# Patient Record
Sex: Female | Born: 1960 | Race: White | Hispanic: No | Marital: Married
Health system: Southern US, Community
[De-identification: ages and names within clinical notes are randomized; demographics above are authoritative.]

---

## 1998-01-28 ENCOUNTER — Inpatient Hospital Stay (HOSPITAL_COMMUNITY): Admission: AD | Admit: 1998-01-28 | Discharge: 1998-01-30 | Payer: Self-pay | Admitting: Obstetrics & Gynecology

## 1999-02-25 ENCOUNTER — Other Ambulatory Visit: Admission: RE | Admit: 1999-02-25 | Discharge: 1999-02-25 | Payer: Self-pay | Admitting: Obstetrics and Gynecology

## 2000-03-13 ENCOUNTER — Other Ambulatory Visit: Admission: RE | Admit: 2000-03-13 | Discharge: 2000-03-13 | Payer: Self-pay | Admitting: Obstetrics and Gynecology

## 2002-08-13 ENCOUNTER — Other Ambulatory Visit: Admission: RE | Admit: 2002-08-13 | Discharge: 2002-08-13 | Payer: Self-pay | Admitting: Obstetrics and Gynecology

## 2003-04-03 ENCOUNTER — Encounter: Admission: RE | Admit: 2003-04-03 | Discharge: 2003-04-03 | Payer: Self-pay | Admitting: Family Medicine

## 2003-04-03 ENCOUNTER — Encounter: Payer: Self-pay | Admitting: Family Medicine

## 2003-04-03 ENCOUNTER — Other Ambulatory Visit: Admission: RE | Admit: 2003-04-03 | Discharge: 2003-04-03 | Payer: Self-pay | Admitting: Obstetrics and Gynecology

## 2003-08-10 ENCOUNTER — Encounter: Admission: RE | Admit: 2003-08-10 | Discharge: 2003-08-10 | Payer: Self-pay | Admitting: Chiropractic Medicine

## 2003-09-21 ENCOUNTER — Encounter: Admission: RE | Admit: 2003-09-21 | Discharge: 2003-09-21 | Payer: Self-pay | Admitting: Orthopaedic Surgery

## 2004-04-20 ENCOUNTER — Encounter: Admission: RE | Admit: 2004-04-20 | Discharge: 2004-04-20 | Payer: Self-pay | Admitting: Obstetrics and Gynecology

## 2004-04-28 ENCOUNTER — Other Ambulatory Visit: Admission: RE | Admit: 2004-04-28 | Discharge: 2004-04-28 | Payer: Self-pay | Admitting: Obstetrics and Gynecology

## 2005-05-06 ENCOUNTER — Other Ambulatory Visit: Admission: RE | Admit: 2005-05-06 | Discharge: 2005-05-06 | Payer: Self-pay | Admitting: Obstetrics and Gynecology

## 2006-06-02 ENCOUNTER — Encounter: Admission: RE | Admit: 2006-06-02 | Discharge: 2006-06-02 | Payer: Self-pay | Admitting: Obstetrics and Gynecology

## 2007-03-20 ENCOUNTER — Encounter: Admission: RE | Admit: 2007-03-20 | Discharge: 2007-03-20 | Payer: Self-pay | Admitting: Obstetrics and Gynecology

## 2007-09-12 ENCOUNTER — Encounter: Admission: RE | Admit: 2007-09-12 | Discharge: 2007-09-12 | Payer: Self-pay | Admitting: Obstetrics and Gynecology

## 2008-07-15 ENCOUNTER — Encounter: Admission: RE | Admit: 2008-07-15 | Discharge: 2008-07-15 | Payer: Self-pay | Admitting: Obstetrics and Gynecology

## 2009-07-27 ENCOUNTER — Encounter: Admission: RE | Admit: 2009-07-27 | Discharge: 2009-07-27 | Payer: Self-pay | Admitting: Obstetrics and Gynecology

## 2009-12-11 ENCOUNTER — Encounter: Admission: RE | Admit: 2009-12-11 | Discharge: 2009-12-11 | Payer: Self-pay | Admitting: Obstetrics and Gynecology

## 2010-09-20 ENCOUNTER — Encounter
Admission: RE | Admit: 2010-09-20 | Discharge: 2010-09-20 | Payer: Self-pay | Source: Home / Self Care | Attending: Obstetrics and Gynecology | Admitting: Obstetrics and Gynecology

## 2010-09-26 ENCOUNTER — Encounter: Payer: Self-pay | Admitting: Obstetrics and Gynecology

## 2011-07-05 ENCOUNTER — Other Ambulatory Visit: Payer: Self-pay | Admitting: Obstetrics and Gynecology

## 2011-07-05 DIAGNOSIS — N63 Unspecified lump in unspecified breast: Secondary | ICD-10-CM

## 2011-07-18 ENCOUNTER — Ambulatory Visit
Admission: RE | Admit: 2011-07-18 | Discharge: 2011-07-18 | Disposition: A | Discharge status: Home / Self Care | Payer: BC Managed Care – PPO | Source: Ambulatory Visit | Attending: Obstetrics and Gynecology | Admitting: Obstetrics and Gynecology

## 2011-07-18 ENCOUNTER — Other Ambulatory Visit: Payer: Self-pay | Admitting: Obstetrics and Gynecology

## 2011-07-18 DIAGNOSIS — N63 Unspecified lump in unspecified breast: Secondary | ICD-10-CM

## 2014-04-17 ENCOUNTER — Other Ambulatory Visit (HOSPITAL_BASED_OUTPATIENT_CLINIC_OR_DEPARTMENT_OTHER): Payer: Self-pay | Admitting: Family Medicine

## 2014-04-17 DIAGNOSIS — IMO0002 Reserved for concepts with insufficient information to code with codable children: Secondary | ICD-10-CM

## 2014-04-19 ENCOUNTER — Ambulatory Visit (HOSPITAL_BASED_OUTPATIENT_CLINIC_OR_DEPARTMENT_OTHER): Payer: BC Managed Care – PPO

## 2014-04-26 ENCOUNTER — Ambulatory Visit (HOSPITAL_BASED_OUTPATIENT_CLINIC_OR_DEPARTMENT_OTHER)
Admission: RE | Admit: 2014-04-26 | Discharge: 2014-04-26 | Disposition: A | Discharge status: Home / Self Care | Payer: BC Managed Care – PPO | Source: Ambulatory Visit | Attending: Family Medicine | Admitting: Family Medicine

## 2014-04-26 DIAGNOSIS — IMO0002 Reserved for concepts with insufficient information to code with codable children: Secondary | ICD-10-CM | POA: Diagnosis not present

## 2015-04-14 ENCOUNTER — Other Ambulatory Visit: Payer: Self-pay | Admitting: Obstetrics and Gynecology

## 2015-04-14 DIAGNOSIS — R928 Other abnormal and inconclusive findings on diagnostic imaging of breast: Secondary | ICD-10-CM

## 2015-04-17 ENCOUNTER — Ambulatory Visit
Admission: RE | Admit: 2015-04-17 | Discharge: 2015-04-17 | Disposition: A | Discharge status: Home / Self Care | Payer: BLUE CROSS/BLUE SHIELD | Source: Ambulatory Visit | Attending: Obstetrics and Gynecology | Admitting: Obstetrics and Gynecology

## 2015-04-17 ENCOUNTER — Other Ambulatory Visit: Payer: Self-pay | Admitting: Obstetrics and Gynecology

## 2015-04-17 DIAGNOSIS — N6320 Unspecified lump in the left breast, unspecified quadrant: Secondary | ICD-10-CM

## 2015-04-17 DIAGNOSIS — R928 Other abnormal and inconclusive findings on diagnostic imaging of breast: Secondary | ICD-10-CM

## 2015-04-21 ENCOUNTER — Ambulatory Visit
Admission: RE | Admit: 2015-04-21 | Discharge: 2015-04-21 | Disposition: A | Discharge status: Home / Self Care | Payer: BLUE CROSS/BLUE SHIELD | Source: Ambulatory Visit | Attending: Obstetrics and Gynecology | Admitting: Obstetrics and Gynecology

## 2015-04-21 ENCOUNTER — Other Ambulatory Visit: Payer: Self-pay | Admitting: Obstetrics and Gynecology

## 2015-04-21 DIAGNOSIS — N6320 Unspecified lump in the left breast, unspecified quadrant: Secondary | ICD-10-CM

## 2016-06-09 IMAGING — US US LT BREAST BX
1 series · 9 of 9 positions shown · non-contrast
Comparison: Previous exams including diagnostic mammogram dated
04/17/2015.

ADDENDUM:
Pathology reveals Left benign breast tissue and demonstrate stromal
hyalinization in which there are features of fibrocytic change
associated with chronic lymphohistiocytic inflammation, and abundant
foamy macrophages. This was found to be concordant by Dr. Pablinho
Jahan. Pathology results were discussed with the patient via
telephone. The patient reported tenderness at the biopsy site and is
doing well otherwise. Post biopsy instructions were reviewed and
questions were answered. The patient was encouraged to call The
[REDACTED] with any additional questions
and or concerns. The patient was instructed to return for annual
screening mammograms. The patient requested her mammograms be done
at The [REDACTED]. She was informed a reminder notice would be
sent regarding this appointment.

Pathology results reported by Carmen Emara Alwena RN on April 22, 2015.
CLINICAL DATA: Patient with left breast mass versus complicated
cyst presents today for either ultrasound-guided biopsy or
aspiration.
EXAM:
ULTRASOUND GUIDED LEFT BREAST CORE NEEDLE BIOPSY

[Series 1: us left breast bx · 0.07mm/px · 9 of 9 slices shown]
[im 1/9]
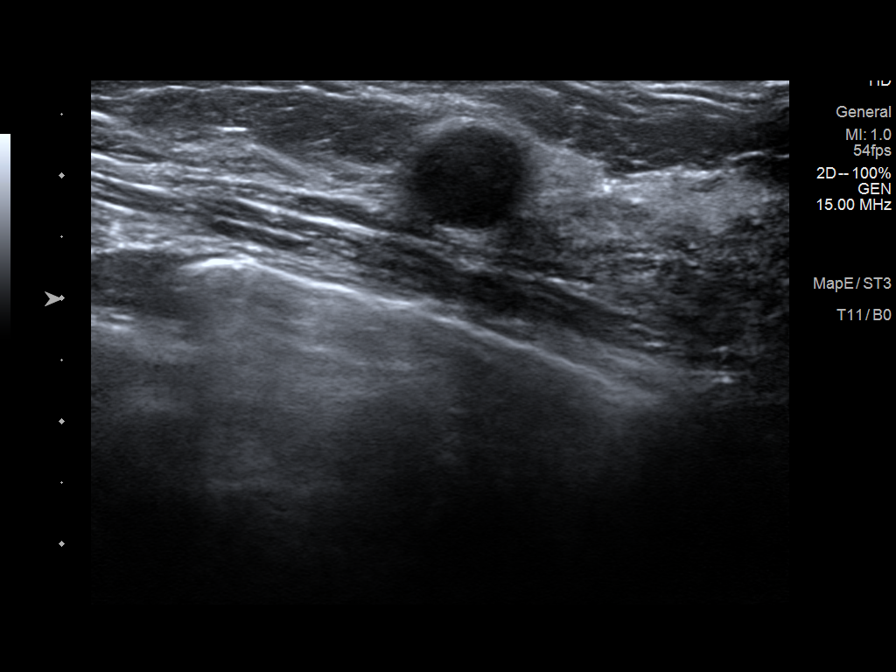
[im 2/9]
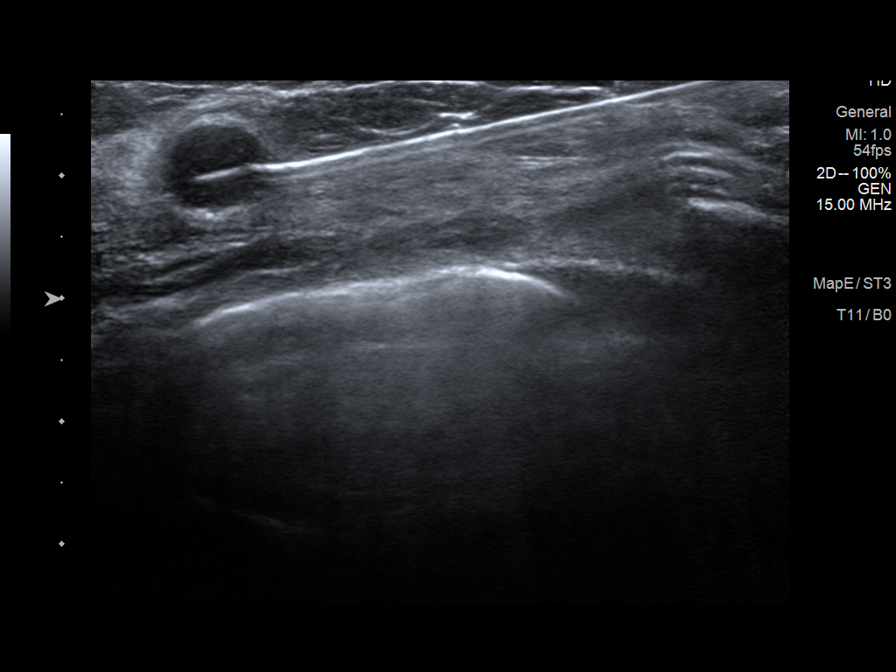
[im 3/9]
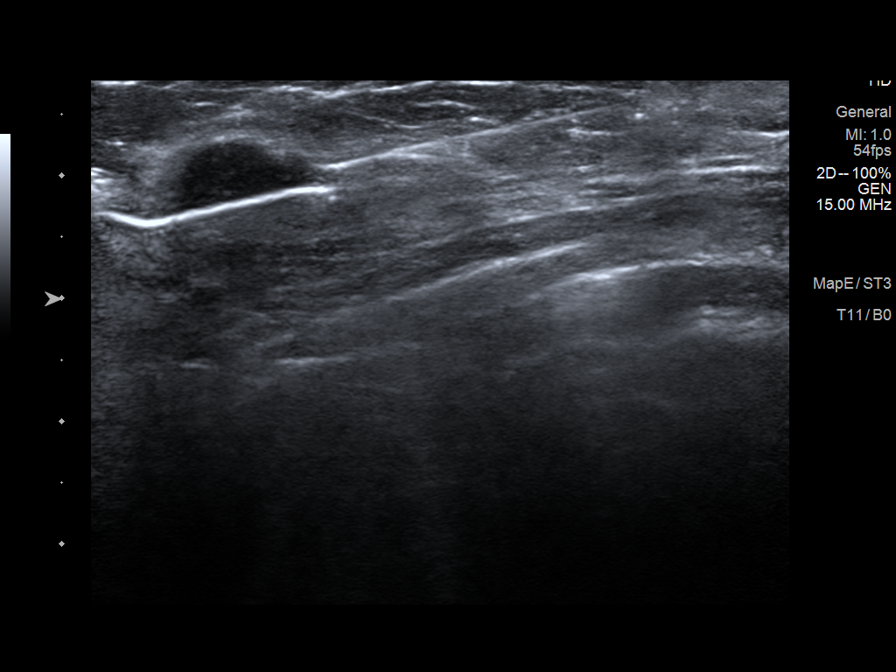
[im 4/9]
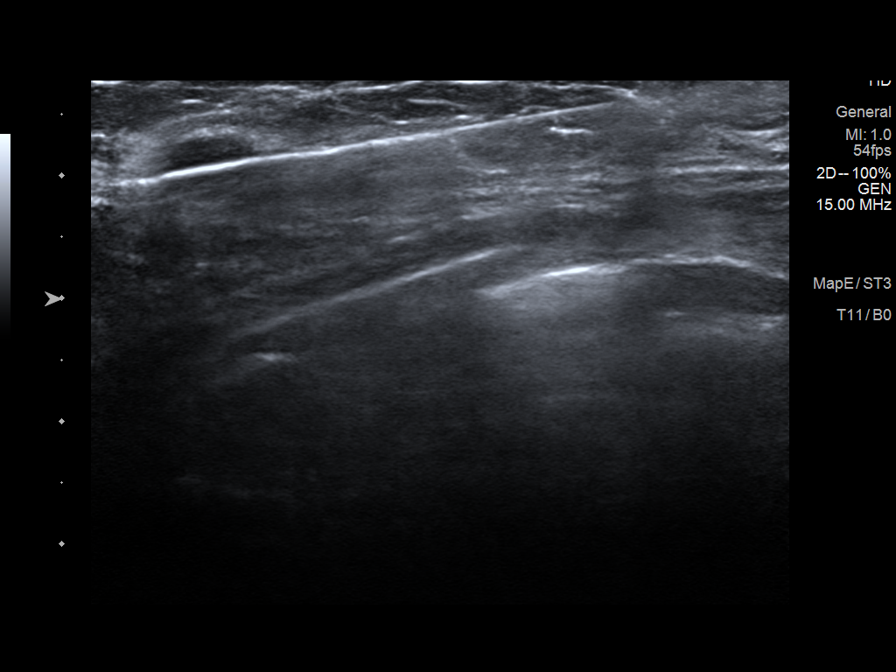
[im 5/9]
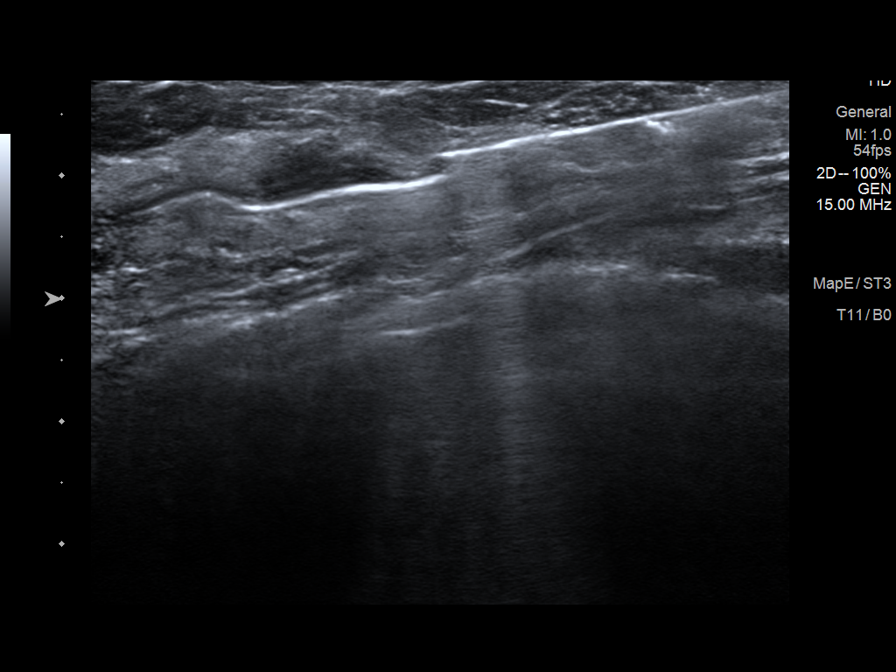
[im 6/9]
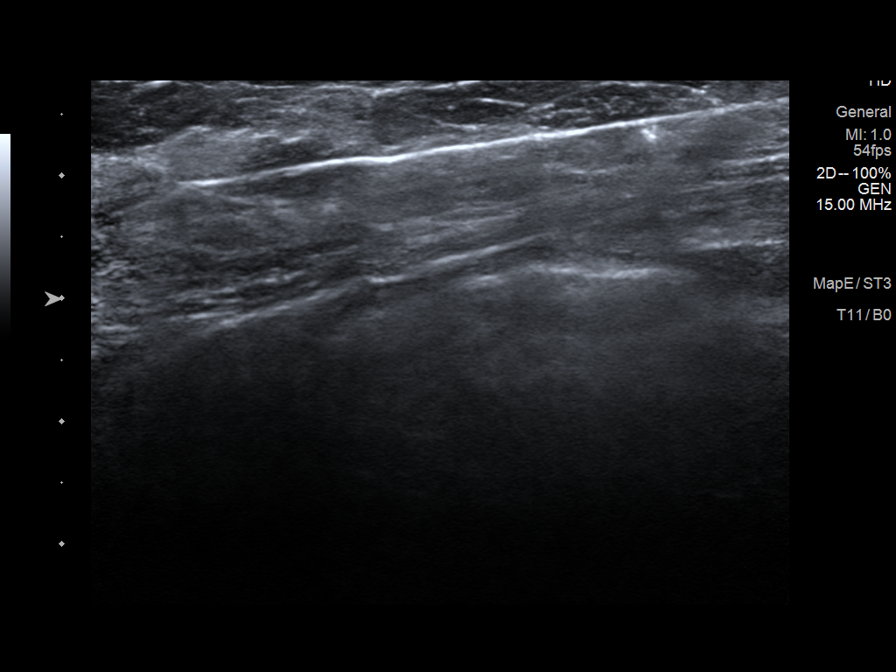
[im 7/9]
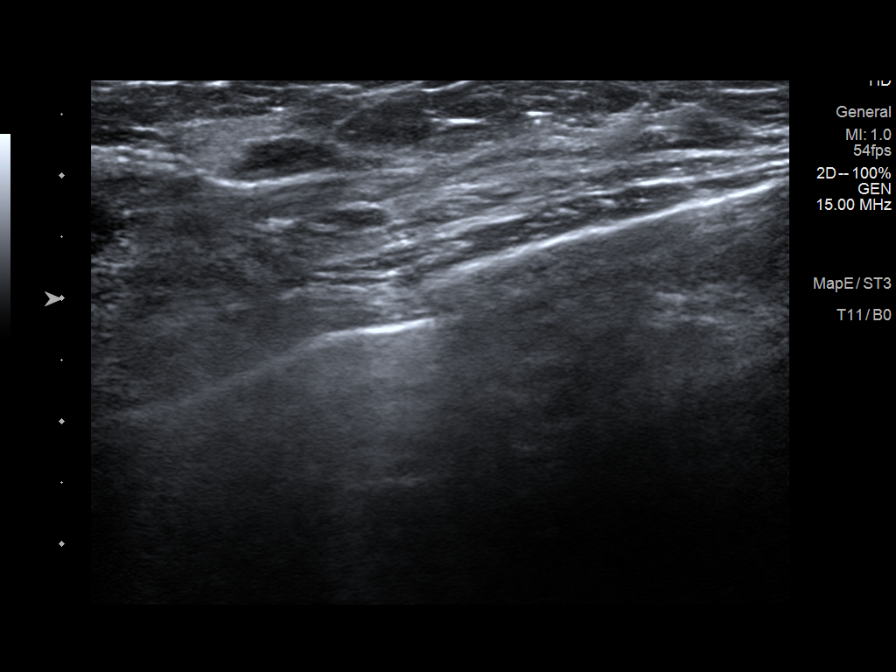
[im 8/9]
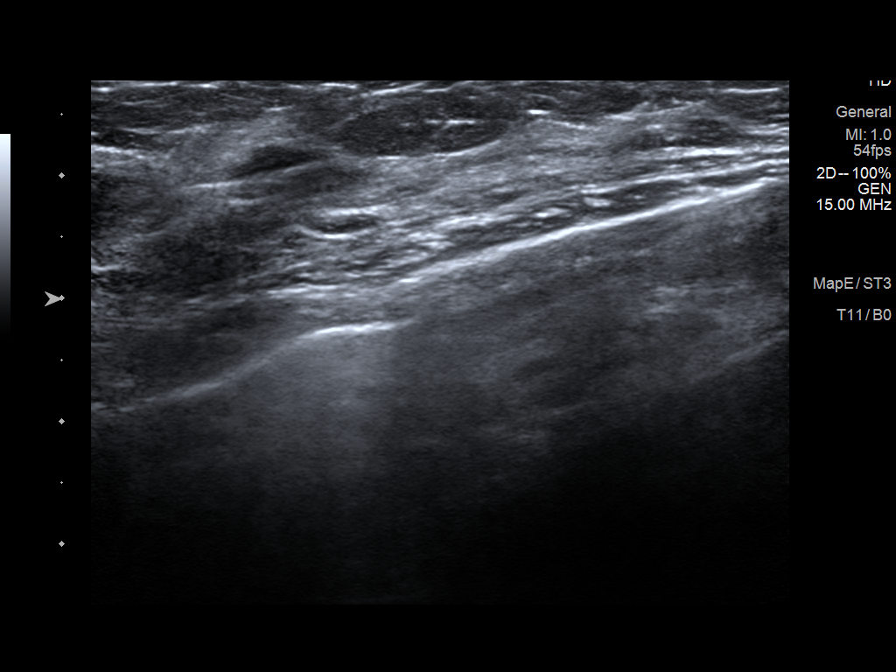
[im 9/9]
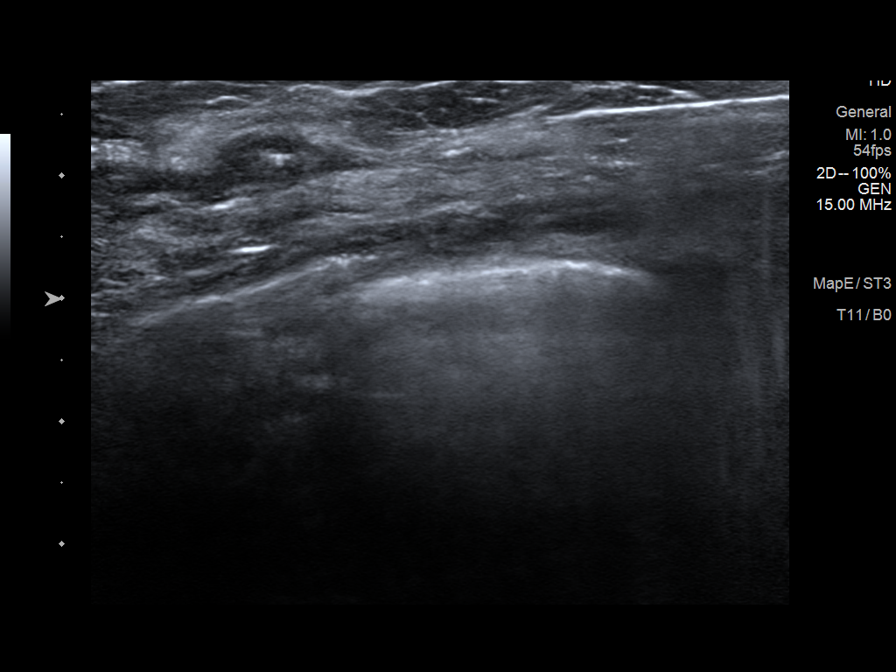

[9 of 9 positions shown; findings below may reference images not displayed]

PROCEDURE:
I met with the patient and we discussed the procedure of
ultrasound-guided biopsy, including benefits and alternatives. We
discussed the high likelihood of a successful procedure. We
discussed the risks of the procedure including infection, bleeding,
tissue injury, clip migration, and inadequate sampling. Informed
written consent was given. The usual time-out protocol was performed
immediately prior to the procedure.

18 gauge needle was advanced into the mass for potential aspiration.
Central aspects of the mass could not be aspirated so
ultrasound-guided core biopsy was then performed.

Using sterile technique and 1% Lidocaine as local anesthetic, under
direct ultrasound visualization, a 12 gauge vacuum-assisted device
was used to perform biopsy of the left breast mass at the 6 o'clock
axis, 3 cm from the nipple,using an inferior approach. At the
conclusion of the procedure, a coil shaped tissue marker clip was
deployed into the biopsy cavity. Follow-up 2-view mammogram was
performed and dictated separately.
IMPRESSION: Ultrasound-guided biopsy of the left breast mass located at the 6
o'clock axis and 3 cm from the nipple. No apparent complications.

## 2016-06-09 IMAGING — MG MM DIAGNOSTIC UNILATERAL L
2 series · 2 of 2 positions shown · non-contrast
Comparison: Previous exam(s).

CLINICAL DATA: Status post ultrasound-guided biopsy of a left
breast mass located at the 6 o'clock axis and 3 cm from the nipple.

EXAM:
DIAGNOSTIC LEFT MAMMOGRAM POST ULTRASOUND BIOPSY

[L ML]
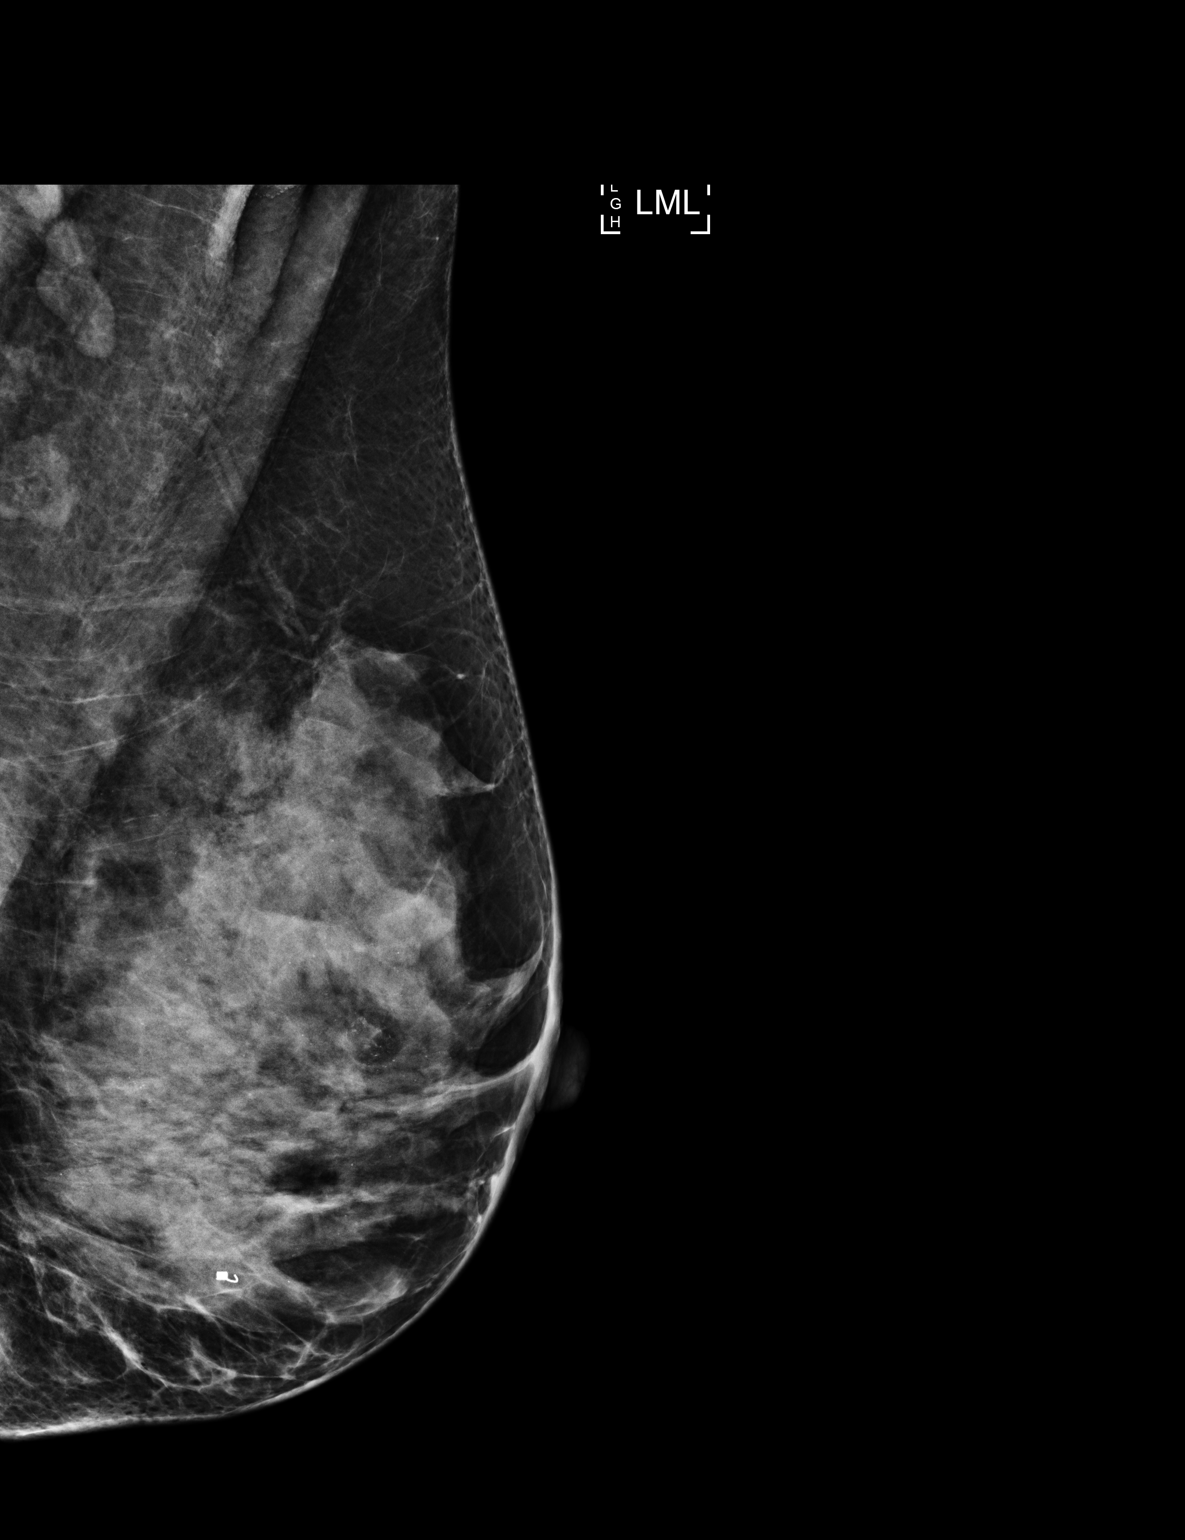

[L CC]
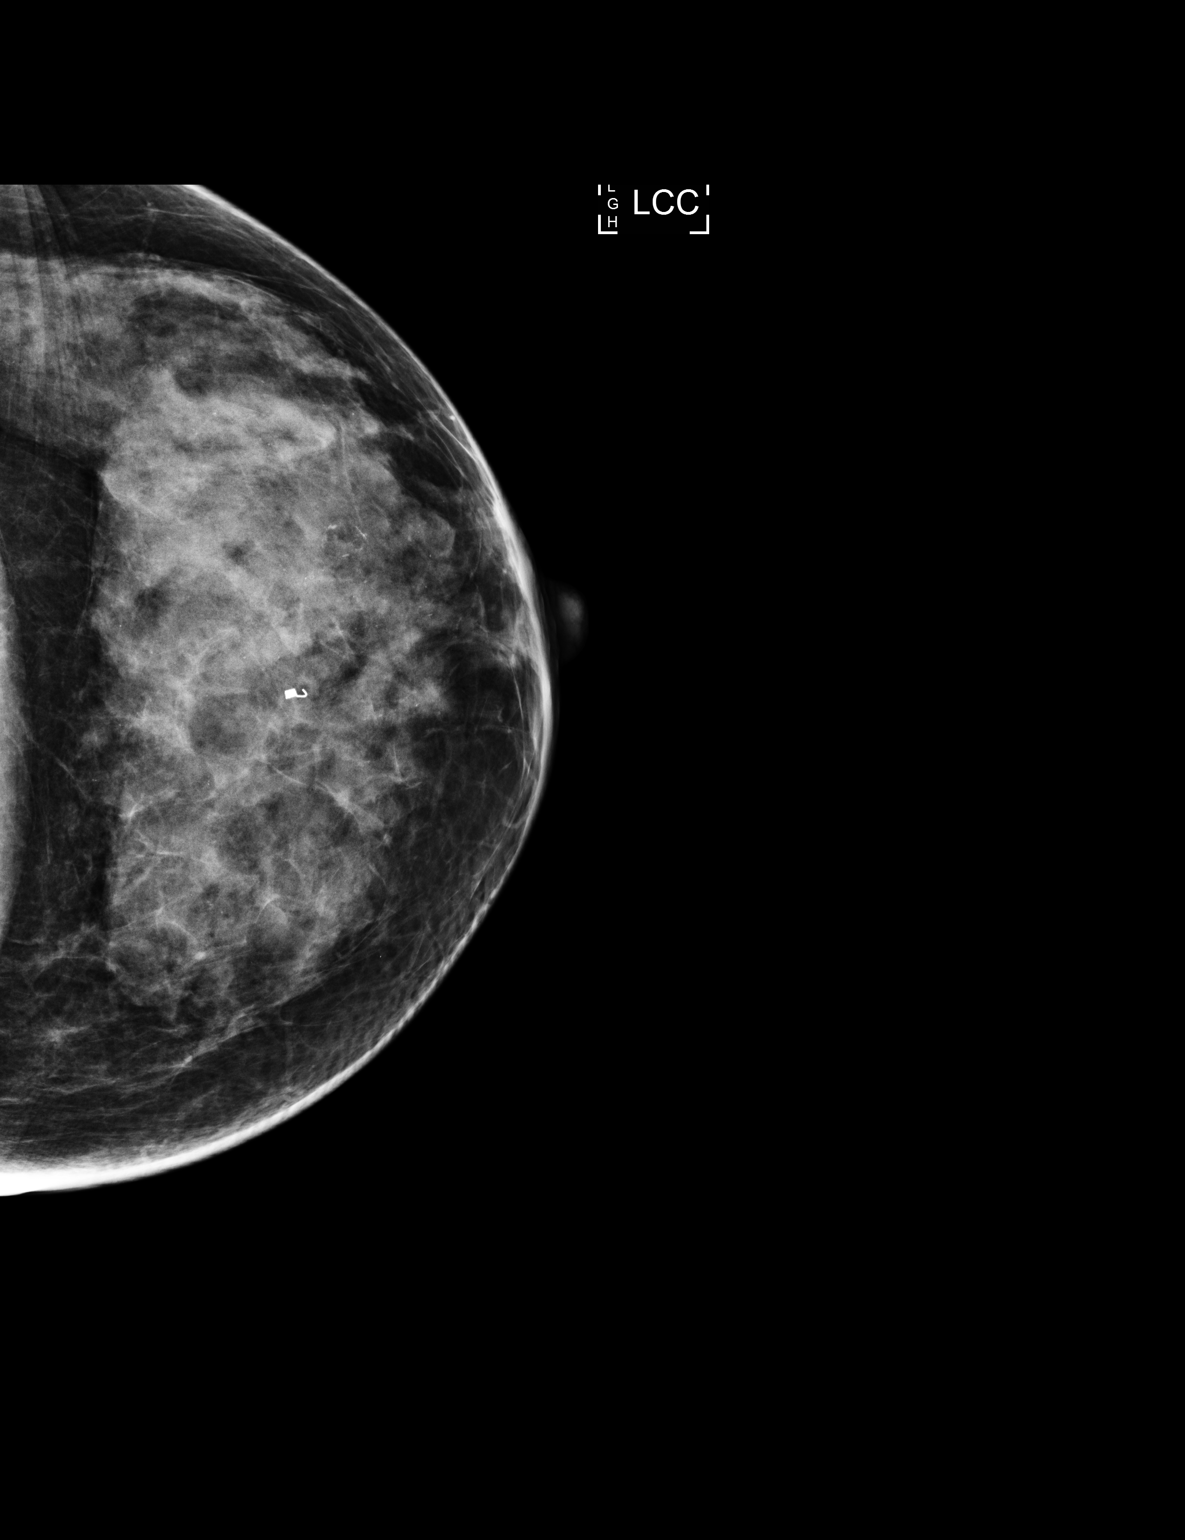

[2 of 2 positions shown; findings below may reference images not displayed]

FINDINGS: Mammographic images were obtained following ultrasound guided biopsy
of the left breast mass at the 6 o'clock axis, 3 cm from the nipple,
at middle depth. Coil shaped biopsy site marker placed at the time
of the procedure. Clip is well positioned within the mass at the 6
o'clock axis.
IMPRESSION: Postprocedure mammogram for biopsy clip placement. Clip is well
positioned within the left breast mass at the 6 o'clock axis.

Final Assessment: Post Procedure Mammograms for Marker Placement

## 2016-07-26 ENCOUNTER — Other Ambulatory Visit: Payer: Self-pay | Admitting: Obstetrics and Gynecology

## 2016-07-26 DIAGNOSIS — Z1231 Encounter for screening mammogram for malignant neoplasm of breast: Secondary | ICD-10-CM

## 2016-08-17 ENCOUNTER — Ambulatory Visit: Payer: BLUE CROSS/BLUE SHIELD
